# Patient Record
Sex: Female | Born: 1955 | Race: Black or African American | Hispanic: No | Marital: Married | Smoking: Former smoker
Health system: Southern US, Community
[De-identification: ages and names within clinical notes are randomized; demographics above are authoritative.]

## PROBLEM LIST (undated history)

## (undated) DIAGNOSIS — I10 Essential (primary) hypertension: Secondary | ICD-10-CM

## (undated) DIAGNOSIS — M1990 Unspecified osteoarthritis, unspecified site: Secondary | ICD-10-CM

## (undated) DIAGNOSIS — J189 Pneumonia, unspecified organism: Secondary | ICD-10-CM

## (undated) HISTORY — PX: HEMORROIDECTOMY: SUR656

## (undated) HISTORY — PX: CARPAL TUNNEL RELEASE: SHX101

## (undated) HISTORY — PX: TRIGGER FINGER RELEASE: SHX641

## (undated) HISTORY — PX: ABDOMINAL HYSTERECTOMY: SHX81

---

## 2016-02-19 ENCOUNTER — Other Ambulatory Visit (HOSPITAL_COMMUNITY): Payer: Self-pay | Admitting: Respiratory Therapy

## 2016-02-19 DIAGNOSIS — G4710 Hypersomnia, unspecified: Secondary | ICD-10-CM

## 2016-02-19 DIAGNOSIS — G4730 Sleep apnea, unspecified: Principal | ICD-10-CM

## 2016-03-15 ENCOUNTER — Ambulatory Visit: Payer: No Typology Code available for payment source | Attending: Internal Medicine | Admitting: Neurology

## 2016-03-15 DIAGNOSIS — G4710 Hypersomnia, unspecified: Secondary | ICD-10-CM

## 2016-03-15 DIAGNOSIS — F5111 Primary hypersomnia: Secondary | ICD-10-CM | POA: Diagnosis present

## 2016-03-15 DIAGNOSIS — G4730 Sleep apnea, unspecified: Secondary | ICD-10-CM | POA: Insufficient documentation

## 2016-03-24 NOTE — Procedures (Signed)
  [facility] Gerold Sar A. Gerilyn Pilgrim, MD     [email or web address]             NOCTURNAL POLYSOMNOGRAPHY   LOCATION: [facility]  Patient Name: Sandra Curry, Sandra Curry Date: 03/15/2016 Gender: Female D.O.B: [birth year] Age (years): 15 Referring Provider: Not Available Height (inches): 62 Interpreting Physician: Beryle Beams MD, ABSM Weight (lbs): 182 RPSGT: Peak, Robert BMI: 33 MRN: [id number] Neck Size: 15.00 CLINICAL INFORMATION Sleep Study Type: NPSG Indication for sleep study: N/A Epworth Sleepiness Score: 4 SLEEP STUDY TECHNIQUE As per the AASM Manual for the Scoring of Sleep and Associated Events v2.3 (April 2016) with a hypopnea requiring 4% desaturations. The channels recorded and monitored were frontal, central and occipital EEG, electrooculogram (EOG), submentalis EMG (chin), nasal and oral airflow, thoracic and abdominal wall motion, anterior tibialis EMG, snore microphone, electrocardiogram, and pulse oximetry. MEDICATIONS Patient's medications include: N/A. Medications self-administered by patient during sleep study : No sleep medicine administered. No current outpatient prescriptions on file.  SLEEP ARCHITECTURE The study was initiated at 9:25:00 PM and ended at 5:02:27 AM. Sleep onset time was 5.5 minutes and the sleep efficiency was 88.6%. The total sleep time was 405.5 minutes. Stage REM latency was 128.5 minutes. The patient spent 7.03% of the night in stage N1 sleep, 67.94% in stage N2 sleep, 7.64% in stage N3 and 17.39% in REM. Alpha intrusion was absent. Supine sleep was 3.70%. RESPIRATORY PARAMETERS The overall apnea/hypopnea index (AHI) was 2.1 per hour. There were 2 total apneas, including 0 obstructive, 2 central and 0 mixed apneas. There were 12 hypopneas and 9 RERAs. The AHI during Stage REM sleep was 2.6 per hour. AHI while supine was 4.0 per hour. The mean oxygen saturation was 94.15%. The minimum SpO2 during sleep was 89.00%. Soft snoring was  noted during this study. CARDIAC DATA The 2 lead EKG demonstrated sinus rhythm. The mean heart rate was 74.40 beats per minute. Other EKG findings include: None. LEG MOVEMENT DATA The total PLMS were 0 with a resulting PLMS index of 0.00. Associated arousal with leg movement index was 0.0.   IMPRESSIONS No significant obstructive sleep apnea occurred during this study. No significant central sleep apnea occurred during this study. The patient had minimal or no oxygen desaturation. The patient snored with Soft snoring volume. No cardiac abnormalities were noted during this study. Clinically significant periodic limb movements did not occur during sleep. No significant associated arousals.   Argie Ramming, MD Diplomate, American Board of Sleep Medicine.

## 2020-10-04 ENCOUNTER — Encounter: Payer: Self-pay | Admitting: Orthopaedic Surgery

## 2021-07-27 NOTE — Progress Notes (Signed)
Sent message, via epic in basket, requesting orders in epic from surgeon.  

## 2021-08-06 ENCOUNTER — Ambulatory Visit: Payer: Self-pay | Admitting: Physician Assistant

## 2021-08-06 DIAGNOSIS — G8929 Other chronic pain: Secondary | ICD-10-CM

## 2021-08-06 DIAGNOSIS — M1712 Unilateral primary osteoarthritis, left knee: Secondary | ICD-10-CM

## 2021-08-06 DIAGNOSIS — M25562 Pain in left knee: Secondary | ICD-10-CM

## 2021-08-06 NOTE — Patient Instructions (Addendum)
DUE TO COVID-19 ONLY ONE VISITOR IS ALLOWED TO COME WITH YOU AND STAY IN THE WAITING ROOM ONLY DURING PRE OP AND PROCEDURE.   **NO VISITORS ARE ALLOWED IN THE SHORT STAY AREA OR RECOVERY ROOM!!**  IF YOU WILL BE ADMITTED INTO THE HOSPITAL YOU ARE ALLOWED ONLY TWO SUPPORT PEOPLE DURING VISITATION HOURS ONLY (7 AM -8PM)   The support person(s) must pass our screening, gel in and out, and wear a mask at all times, including in the patient's room. Patients must also wear a mask when staff or their support person are in the room. Visitors GUEST BADGE MUST BE WORN VISIBLY  One adult visitor may remain with you overnight and MUST be in the room by 8 P.M.  No visitors under the age of 38. Any visitor under the age of 72 must be accompanied by an adult.     Your procedure is scheduled on: 08/17/21   Report to [facility] Main Entrance    Report to short stay at: 5:15 AM   Call this number if you have problems the morning of surgery [phone]   Do not eat food :After Midnight.   May have liquids until : 4:30 AM   day of surgery  CLEAR LIQUID DIET  Foods Allowed                                                                     Foods Excluded  Water, Black Coffee and tea, regular and decaf                             liquids that you cannot  Plain Jell-O in any flavor  (No red)                                           see through such as: Fruit ices (not with fruit pulp)                                     milk, soups, orange juice              Iced Popsicles (No red)                                    All solid food                                   Apple juices Sports drinks like Gatorade (No red) Lightly seasoned clear broth or consume(fat free) Sugar  Sample Menu Breakfast                                Lunch  Supper Cranberry juice                    Beef broth                            Chicken broth Jell-O                                      Grape juice                           Apple juice Coffee or tea                        Jell-O                                      Popsicle                                                Coffee or tea                        Coffee or tea     NO SOLID FOOD AFTER MIDNIGHT THE NIGHT PRIOR TO SURGERY. NOTHING BY MOUTH EXCEPT CLEAR LIQUIDS UNTIL: 4:30 AM . PLEASE FINISH ENSURE DRINK PER SURGEON ORDER  WHICH NEEDS TO BE COMPLETED AT : 4:30 AM.   Oral Hygiene is also important to reduce your risk of infection.                                    Remember - BRUSH YOUR TEETH THE MORNING OF SURGERY WITH YOUR REGULAR TOOTHPASTE   Do NOT smoke after Midnight   Take these medicines the morning of surgery with A SIP OF WATER: N/A  DO NOT TAKE ANY ORAL DIABETIC MEDICATIONS DAY OF YOUR SURGERY                              You may not have any metal on your body including hair pins, jewelry, and body piercing             Do not wear make-up, lotions, powders, perfumes/cologne, or deodorant  Do not wear nail polish including gel and S&S, artificial/acrylic nails, or any other type of covering on natural nails including finger and toenails. If you have artificial nails, gel coating, etc. that needs to be removed by a nail salon please have this removed prior to surgery or surgery may need to be canceled/ delayed if the surgeon/ anesthesia feels like they are unable to be safely monitored.   Do not shave  48 hours prior to surgery.               Do not bring valuables to the hospital. [facility] IS NOT             RESPONSIBLE   FOR VALUABLES.   Contacts, dentures or bridgework may not be worn into surgery.  Bring small overnight bag day of surgery.    Patients discharged on the day of surgery will not be allowed to drive home.   Special Instructions: Bring a copy of your healthcare power of attorney and living will documents         the day of surgery if you haven't scanned them before.               Please read over the following fact sheets you were given: IF YOU HAVE QUESTIONS ABOUT YOUR PRE-OP INSTRUCTIONS PLEASE CALL [phone]     [facility] - Preparing for Surgery Before surgery, you can play an important role.  Because skin is not sterile, your skin needs to be as free of germs as possible.  You can reduce the number of germs on your skin by washing with CHG (chlorahexidine gluconate) soap before surgery.  CHG is an antiseptic cleaner which kills germs and bonds with the skin to continue killing germs even after washing. Please DO NOT use if you have an allergy to CHG or antibacterial soaps.  If your skin becomes reddened/irritated stop using the CHG and inform your nurse when you arrive at Short Stay. Do not shave (including legs and underarms) for at least 48 hours prior to the first CHG shower.  You may shave your face/neck. Please follow these instructions carefully:  1.  Shower with CHG Soap the night before surgery and the  morning of Surgery.  2.  If you choose to wash your hair, wash your hair first as usual with your  normal  shampoo.  3.  After you shampoo, rinse your hair and body thoroughly to remove the  shampoo.                           4.  Use CHG as you would any other liquid soap.  You can apply chg directly  to the skin and wash                       Gently with a scrungie or clean washcloth.  5.  Apply the CHG Soap to your body ONLY FROM THE NECK DOWN.   Do not use on face/ open                           Wound or open sores. Avoid contact with eyes, ears mouth and genitals (private parts).                       Wash face,  Genitals (private parts) with your normal soap.             6.  Wash thoroughly, paying special attention to the area where your surgery  will be performed.  7.  Thoroughly rinse your body with warm water from the neck down.  8.  DO NOT shower/wash with your normal soap after using and rinsing off  the CHG Soap.                9.  Pat  yourself dry with a clean towel.            10.  Wear clean pajamas.            11.  Place clean sheets on your bed the night of your first shower and do not  sleep with pets. Day of Surgery :  Do not apply any lotions/deodorants the morning of surgery.  Please wear clean clothes to the hospital/surgery center.  FAILURE TO FOLLOW THESE INSTRUCTIONS MAY RESULT IN THE CANCELLATION OF YOUR SURGERY PATIENT SIGNATURE_________________________________  NURSE SIGNATURE__________________________________  ________________________________________________________________________    Rogelia Mire  An incentive spirometer is a tool that can help keep your lungs clear and active. This tool measures how well you are filling your lungs with each breath. Taking long deep breaths may help reverse or decrease the chance of developing breathing (pulmonary) problems (especially infection) following: A long period of time when you are unable to move or be active. BEFORE THE PROCEDURE  If the spirometer includes an indicator to show your best effort, your nurse or respiratory therapist will set it to a desired goal. If possible, sit up straight or lean slightly forward. Try not to slouch. Hold the incentive spirometer in an upright position. INSTRUCTIONS FOR USE  Sit on the edge of your bed if possible, or sit up as far as you can in bed or on a chair. Hold the incentive spirometer in an upright position. Breathe out normally. Place the mouthpiece in your mouth and seal your lips tightly around it. Breathe in slowly and as deeply as possible, raising the piston or the ball toward the top of the column. Hold your breath for 3-5 seconds or for as long as possible. Allow the piston or ball to fall to the bottom of the column. Remove the mouthpiece from your mouth and breathe out normally. Rest for a few seconds and repeat Steps 1 through 7 at least 10 times every 1-2 hours when you are awake. Take your time  and take a few normal breaths between deep breaths. The spirometer may include an indicator to show your best effort. Use the indicator as a goal to work toward during each repetition. After each set of 10 deep breaths, practice coughing to be sure your lungs are clear. If you have an incision (the cut made at the time of surgery), support your incision when coughing by placing a pillow or rolled up towels firmly against it. Once you are able to get out of bed, walk around indoors and cough well. You may stop using the incentive spirometer when instructed by your caregiver.  RISKS AND COMPLICATIONS Take your time so you do not get dizzy or light-headed. If you are in pain, you may need to take or ask for pain medication before doing incentive spirometry. It is harder to take a deep breath if you are having pain. AFTER USE Rest and breathe slowly and easily. It can be helpful to keep track of a log of your progress. Your caregiver can provide you with a simple table to help with this. If you are using the spirometer at home, follow these instructions: SEEK MEDICAL CARE IF:  You are having difficultly using the spirometer. You have trouble using the spirometer as often as instructed. Your pain medication is not giving enough relief while using the spirometer. You develop fever of 100.5 F (38.1 C) or higher. SEEK IMMEDIATE MEDICAL CARE IF:  You cough up bloody sputum that had not been present before. You develop fever of 102 F (38.9 C) or greater. You develop worsening pain at or near the incision site. MAKE SURE YOU:  Understand these instructions. Will watch your condition. Will get help right away if you are not doing well or get worse. Document Released: 12/23/2006 Document Revised: 11/04/2011 Document Reviewed: 02/23/2007 [employer] Patient Information  2014 [employer].   ________________________________________________________________________

## 2021-08-06 NOTE — H&P (Signed)
TOTAL KNEE ADMISSION H&P  Patient is being admitted for left total knee arthroplasty.  Subjective:  Chief Complaint:left knee pain.  HPI: Sandra Curry, 65 y.o. female, has a history of pain and functional disability in the left knee due to arthritis and has failed non-surgical conservative treatments for greater than 12 weeks to includeNSAID's and/or analgesics, corticosteriod injections, and activity modification.  Onset of symptoms was gradual, starting 4 years ago with gradually worsening course since that time. The patient noted prior procedures on the knee to include  arthroscopy and menisectomy on the left knee(s).  Patient currently rates pain in the left knee(s) at 8 out of 10 with activity. Patient has night pain, worsening of pain with activity and weight bearing, pain that interferes with activities of daily living, pain with passive range of motion, crepitus, and joint swelling.  Patient has evidence of periarticular osteophytes and joint space narrowing by imaging studies. There is no active infection.  There are no problems to display for this patient.  No past medical history on file.  PMHx: HTN, HLD, hx smoker  Current Outpatient Medications  Medication Sig Dispense Refill Last Dose   alendronate (FOSAMAX) 70 MG tablet Take 70 mg by mouth every Sunday.      benazepril (LOTENSIN) 20 MG tablet Take 20 mg by mouth daily.      hydrochlorothiazide (HYDRODIURIL) 12.5 MG tablet Take 12.5 mg by mouth every morning.      meloxicam (MOBIC) 15 MG tablet Take 15 mg by mouth daily.      rosuvastatin (CRESTOR) 10 MG tablet Take 10 mg by mouth at bedtime.      No current facility-administered medications for this visit.   No Known Allergies  Social History   Tobacco Use   Smoking status: Not on file   Smokeless tobacco: Not on file  Substance Use Topics   Alcohol use: Not on file    No family history on file.   Review of Systems  Constitutional:  Positive for activity change.   Cardiovascular:  Positive for chest pain.  Gastrointestinal:  Positive for blood in stool.  Musculoskeletal:  Positive for arthralgias.  All other systems reviewed and are negative.  Objective:  Physical Exam Constitutional:      General: She is not in acute distress.    Appearance: Normal appearance.  HENT:     Head: Normocephalic and atraumatic.  Eyes:     Extraocular Movements: Extraocular movements intact.     Pupils: Pupils are equal, round, and reactive to light.  Cardiovascular:     Rate and Rhythm: Normal rate and regular rhythm.     Pulses: Normal pulses.     Heart sounds: Normal heart sounds. No murmur heard. Pulmonary:     Effort: Pulmonary effort is normal. No respiratory distress.     Breath sounds: Normal breath sounds. No wheezing.  Abdominal:     General: Abdomen is flat. Bowel sounds are normal. There is no distension.     Palpations: Abdomen is soft.     Tenderness: There is no abdominal tenderness.  Musculoskeletal:     Cervical back: Normal range of motion and neck supple.     Comments: : Examination of the left lower extremity shows she is neurovascularly intact.  She has joint line tenderness with the knee.  Mild swelling.  No significant effusion.  No warmth or erythema.  Decent range of motion.  Lymphadenopathy:     Cervical: No cervical adenopathy.  Skin:  General: Skin is warm and dry.     Findings: No erythema or rash.  Neurological:     General: No focal deficit present.     Mental Status: She is alert and oriented to person, place, and time.  Psychiatric:        Mood and Affect: Mood normal.        Behavior: Behavior normal.    Vital signs in last 24 hours: @VSRANGES @  Labs:   Estimated body mass index is 33.29 kg/m as calculated from the following:   Height as of 03/15/16: 5\' 2"  (1.575 m).   Weight as of 03/15/16: 82.6 kg.   Imaging Review Plain radiographs demonstrate moderate degenerative joint disease of the left knee(s). The  overall alignment ismild varus. The bone quality appears to be good for age and reported activity level.      Assessment/Plan:  End stage arthritis, left knee   The patient history, physical examination, clinical judgment of the provider and imaging studies are consistent with end stage degenerative joint disease of the left knee(s) and total knee arthroplasty is deemed medically necessary. The treatment options including medical management, injection therapy arthroscopy and arthroplasty were discussed at length. The risks and benefits of total knee arthroplasty were presented and reviewed. The risks due to aseptic loosening, infection, stiffness, patella tracking problems, thromboembolic complications and other imponderables were discussed. The patient acknowledged the explanation, agreed to proceed with the plan and consent was signed. Patient is being admitted for inpatient treatment for surgery, pain control, PT, OT, prophylactic antibiotics, VTE prophylaxis, progressive ambulation and ADL's and discharge planning. The patient is planning to be discharged  home with outpt PT.    Anticipated LOS equal to or greater than 2 midnights due to - Age 50 and older with one or more of the following:  - Obesity  - Expected need for hospital services (PT, OT, Nursing) required for safe  discharge  - Anticipated need for postoperative skilled nursing care or inpatient rehab  - Active co-morbidities: None OR   - Unanticipated findings during/Post Surgery: None  - Patient is a high risk of re-admission due to: None

## 2021-08-06 NOTE — H&P (View-Only) (Signed)
TOTAL KNEE ADMISSION H&P  Patient is being admitted for left total knee arthroplasty.  Subjective:  Chief Complaint:left knee pain.  HPI: Sandra Curry, 65 y.o. female, has a history of pain and functional disability in the left knee due to arthritis and has failed non-surgical conservative treatments for greater than 12 weeks to includeNSAID's and/or analgesics, corticosteriod injections, and activity modification.  Onset of symptoms was gradual, starting 4 years ago with gradually worsening course since that time. The patient noted prior procedures on the knee to include  arthroscopy and menisectomy on the left knee(s).  Patient currently rates pain in the left knee(s) at 8 out of 10 with activity. Patient has night pain, worsening of pain with activity and weight bearing, pain that interferes with activities of daily living, pain with passive range of motion, crepitus, and joint swelling.  Patient has evidence of periarticular osteophytes and joint space narrowing by imaging studies. There is no active infection.  There are no problems to display for this patient.  No past medical history on file.  PMHx: HTN, HLD, hx smoker  Current Outpatient Medications  Medication Sig Dispense Refill Last Dose   alendronate (FOSAMAX) 70 MG tablet Take 70 mg by mouth every Sunday.      benazepril (LOTENSIN) 20 MG tablet Take 20 mg by mouth daily.      hydrochlorothiazide (HYDRODIURIL) 12.5 MG tablet Take 12.5 mg by mouth every morning.      meloxicam (MOBIC) 15 MG tablet Take 15 mg by mouth daily.      rosuvastatin (CRESTOR) 10 MG tablet Take 10 mg by mouth at bedtime.      No current facility-administered medications for this visit.   No Known Allergies  Social History   Tobacco Use   Smoking status: Not on file   Smokeless tobacco: Not on file  Substance Use Topics   Alcohol use: Not on file    No family history on file.   Review of Systems  Constitutional:  Positive for activity change.   Cardiovascular:  Positive for chest pain.  Gastrointestinal:  Positive for blood in stool.  Musculoskeletal:  Positive for arthralgias.  All other systems reviewed and are negative.  Objective:  Physical Exam Constitutional:      General: She is not in acute distress.    Appearance: Normal appearance.  HENT:     Head: Normocephalic and atraumatic.  Eyes:     Extraocular Movements: Extraocular movements intact.     Pupils: Pupils are equal, round, and reactive to light.  Cardiovascular:     Rate and Rhythm: Normal rate and regular rhythm.     Pulses: Normal pulses.     Heart sounds: Normal heart sounds. No murmur heard. Pulmonary:     Effort: Pulmonary effort is normal. No respiratory distress.     Breath sounds: Normal breath sounds. No wheezing.  Abdominal:     General: Abdomen is flat. Bowel sounds are normal. There is no distension.     Palpations: Abdomen is soft.     Tenderness: There is no abdominal tenderness.  Musculoskeletal:     Cervical back: Normal range of motion and neck supple.     Comments: : Examination of the left lower extremity shows she is neurovascularly intact.  She has joint line tenderness with the knee.  Mild swelling.  No significant effusion.  No warmth or erythema.  Decent range of motion.  Lymphadenopathy:     Cervical: No cervical adenopathy.  Skin:      General: Skin is warm and dry.     Findings: No erythema or rash.  Neurological:     General: No focal deficit present.     Mental Status: She is alert and oriented to person, place, and time.  Psychiatric:        Mood and Affect: Mood normal.        Behavior: Behavior normal.    Vital signs in last 24 hours: @VSRANGES @  Labs:   Estimated body mass index is 33.29 kg/m as calculated from the following:   Height as of 03/15/16: 5\' 2"  (1.575 m).   Weight as of 03/15/16: 82.6 kg.   Imaging Review Plain radiographs demonstrate moderate degenerative joint disease of the left knee(s). The  overall alignment ismild varus. The bone quality appears to be good for age and reported activity level.      Assessment/Plan:  End stage arthritis, left knee   The patient history, physical examination, clinical judgment of the provider and imaging studies are consistent with end stage degenerative joint disease of the left knee(s) and total knee arthroplasty is deemed medically necessary. The treatment options including medical management, injection therapy arthroscopy and arthroplasty were discussed at length. The risks and benefits of total knee arthroplasty were presented and reviewed. The risks due to aseptic loosening, infection, stiffness, patella tracking problems, thromboembolic complications and other imponderables were discussed. The patient acknowledged the explanation, agreed to proceed with the plan and consent was signed. Patient is being admitted for inpatient treatment for surgery, pain control, PT, OT, prophylactic antibiotics, VTE prophylaxis, progressive ambulation and ADL's and discharge planning. The patient is planning to be discharged  home with outpt PT.    Anticipated LOS equal to or greater than 2 midnights due to - Age 67 and older with one or more of the following:  - Obesity  - Expected need for hospital services (PT, OT, Nursing) required for safe  discharge  - Anticipated need for postoperative skilled nursing care or inpatient rehab  - Active co-morbidities: None OR   - Unanticipated findings during/Post Surgery: None  - Patient is a high risk of re-admission due to: None

## 2021-08-07 ENCOUNTER — Encounter (HOSPITAL_COMMUNITY): Payer: Self-pay

## 2021-08-07 ENCOUNTER — Other Ambulatory Visit: Payer: Self-pay

## 2021-08-07 ENCOUNTER — Encounter (HOSPITAL_COMMUNITY)
Admission: RE | Admit: 2021-08-07 | Discharge: 2021-08-07 | Disposition: A | Discharge status: Home / Self Care | Payer: 59 | Source: Ambulatory Visit | Attending: Orthopedic Surgery | Admitting: Orthopedic Surgery

## 2021-08-07 VITALS — BP 121/76 | HR 58 | Temp 98.2°F | Ht 62.0 in | Wt 166.0 lb

## 2021-08-07 DIAGNOSIS — I2510 Atherosclerotic heart disease of native coronary artery without angina pectoris: Secondary | ICD-10-CM | POA: Diagnosis not present

## 2021-08-07 DIAGNOSIS — M1712 Unilateral primary osteoarthritis, left knee: Secondary | ICD-10-CM | POA: Insufficient documentation

## 2021-08-07 DIAGNOSIS — G8929 Other chronic pain: Secondary | ICD-10-CM | POA: Diagnosis not present

## 2021-08-07 DIAGNOSIS — Z01818 Encounter for other preprocedural examination: Secondary | ICD-10-CM | POA: Diagnosis present

## 2021-08-07 DIAGNOSIS — M25562 Pain in left knee: Secondary | ICD-10-CM | POA: Diagnosis not present

## 2021-08-07 HISTORY — DX: Essential (primary) hypertension: I10

## 2021-08-07 HISTORY — DX: Pneumonia, unspecified organism: J18.9

## 2021-08-07 HISTORY — DX: Unspecified osteoarthritis, unspecified site: M19.90

## 2021-08-07 LAB — CBC WITH DIFFERENTIAL/PLATELET
Abs Immature Granulocytes: 0.03 10*3/uL (ref 0.00–0.07)
Basophils Absolute: 0.1 10*3/uL (ref 0.0–0.1)
Basophils Relative: 1 %
Eosinophils Absolute: 0.1 10*3/uL (ref 0.0–0.5)
Eosinophils Relative: 1 %
HCT: 34.1 % — ABNORMAL LOW (ref 36.0–46.0)
Hemoglobin: 10.6 g/dL — ABNORMAL LOW (ref 12.0–15.0)
Immature Granulocytes: 0 %
Lymphocytes Relative: 40 %
Lymphs Abs: 2.8 10*3/uL (ref 0.7–4.0)
MCH: 24.9 pg — ABNORMAL LOW (ref 26.0–34.0)
MCHC: 31.1 g/dL (ref 30.0–36.0)
MCV: 80 fL (ref 80.0–100.0)
Monocytes Absolute: 0.8 10*3/uL (ref 0.1–1.0)
Monocytes Relative: 11 %
Neutro Abs: 3.2 10*3/uL (ref 1.7–7.7)
Neutrophils Relative %: 47 %
Platelets: 339 10*3/uL (ref 150–400)
RBC: 4.26 MIL/uL (ref 3.87–5.11)
RDW: 14.7 % (ref 11.5–15.5)
WBC: 6.9 10*3/uL (ref 4.0–10.5)
nRBC: 0 % (ref 0.0–0.2)

## 2021-08-07 LAB — COMPREHENSIVE METABOLIC PANEL
ALT: 18 U/L (ref 0–44)
AST: 19 U/L (ref 15–41)
Albumin: 4.1 g/dL (ref 3.5–5.0)
Alkaline Phosphatase: 51 U/L (ref 38–126)
Anion gap: 5 (ref 5–15)
BUN: 16 mg/dL (ref 8–23)
CO2: 27 mmol/L (ref 22–32)
Calcium: 9.1 mg/dL (ref 8.9–10.3)
Chloride: 107 mmol/L (ref 98–111)
Creatinine, Ser: 0.59 mg/dL (ref 0.44–1.00)
GFR, Estimated: >60 mL/min (ref >60)
Glucose, Bld: 98 mg/dL (ref 70–99)
Potassium: 4.2 mmol/L (ref 3.5–5.1)
Sodium: 139 mmol/L (ref 135–145)
Total Bilirubin: 0.5 mg/dL (ref 0.3–1.2)
Total Protein: 7.2 g/dL (ref 6.5–8.1)

## 2021-08-07 LAB — URINALYSIS, ROUTINE W REFLEX MICROSCOPIC
Bilirubin Urine: NEGATIVE
Glucose, UA: NEGATIVE mg/dL
Hgb urine dipstick: NEGATIVE
Ketones, ur: NEGATIVE mg/dL
Leukocytes,Ua: NEGATIVE
Nitrite: NEGATIVE
Protein, ur: NEGATIVE mg/dL
Specific Gravity, Urine: 1.005 (ref 1.005–1.030)
pH: 7 (ref 5.0–8.0)

## 2021-08-07 LAB — SURGICAL PCR SCREEN
MRSA, PCR: NEGATIVE
Staphylococcus aureus: NEGATIVE

## 2021-08-07 NOTE — Progress Notes (Signed)
COVID Vaccine Completed: Yes Date COVID Vaccine completed: 2021 x 1 COVID vaccine manufacturer: [employer]  COVID Test: N/A PCP - Dr. Kirstie Peri. : Clearance: 07/05/21: Chart Cardiologist -   Chest x-ray -  EKG -  Stress Test -  ECHO -  Cardiac Cath -  Pacemaker/ICD device last checked:  Sleep Study -  CPAP -   Fasting Blood Sugar -  Checks Blood Sugar _____ times a day  Blood Thinner Instructions: Aspirin Instructions: Last Dose:  Anesthesia review:   Patient denies shortness of breath, fever, cough and chest pain at PAT appointment   Patient verbalized understanding of instructions that were given to them at the PAT appointment. Patient was also instructed that they will need to review over the PAT instructions again at home before surgery.

## 2021-08-16 ENCOUNTER — Encounter (HOSPITAL_COMMUNITY): Payer: Self-pay | Admitting: Orthopedic Surgery

## 2021-08-16 NOTE — Anesthesia Preprocedure Evaluation (Addendum)
Anesthesia Evaluation  Patient identified by MRN, date of birth, ID band Patient awake    Reviewed: Allergy & Precautions, NPO status , Patient's Chart, lab work & pertinent test results, reviewed documented beta blocker date and time   Airway Mallampati: II  TM Distance: >3 FB Neck ROM: Full    Dental no notable dental hx. (+) Teeth Intact, Dental Advisory Given   Pulmonary Patient abstained from smoking., former smoker,    Pulmonary exam normal breath sounds clear to auscultation       Cardiovascular hypertension, Pt. on medications Normal cardiovascular exam Rhythm:Regular Rate:Normal  EKG 08/07/21 NSR, Normal EKG   Neuro/Psych negative neurological ROS  negative psych ROS   GI/Hepatic negative GI ROS, Neg liver ROS,   Endo/Other  negative endocrine ROSHyperlipidemia Osteoporosis  Renal/GU negative Renal ROS  negative genitourinary   Musculoskeletal  (+) Arthritis , Osteoarthritis,  OA Left knee   Abdominal (+) + obese,   Peds  Hematology negative hematology ROS (+)   Anesthesia Other Findings   Reproductive/Obstetrics                            Anesthesia Physical Anesthesia Plan  ASA: 2  Anesthesia Plan: Spinal   Post-op Pain Management: Regional block   Induction:   PONV Risk Score and Plan: 3 and Treatment may vary due to age or medical condition, Propofol infusion, Ondansetron and Midazolam  Airway Management Planned: Natural Airway  Additional Equipment:   Intra-op Plan:   Post-operative Plan:   Informed Consent: I have reviewed the patients History and Physical, chart, labs and discussed the procedure including the risks, benefits and alternatives for the proposed anesthesia with the patient or authorized representative who has indicated his/her understanding and acceptance.     Dental advisory given  Plan Discussed with: CRNA and Anesthesiologist  Anesthesia  Plan Comments:        Anesthesia Quick Evaluation

## 2021-08-17 ENCOUNTER — Ambulatory Visit (HOSPITAL_COMMUNITY): Payer: 59 | Admitting: Anesthesiology

## 2021-08-17 ENCOUNTER — Encounter (HOSPITAL_COMMUNITY): Payer: Self-pay | Admitting: Orthopedic Surgery

## 2021-08-17 ENCOUNTER — Ambulatory Visit (HOSPITAL_COMMUNITY)
Admission: RE | Admit: 2021-08-17 | Discharge: 2021-08-17 | Disposition: A | Discharge status: Home / Self Care | Payer: 59 | Source: Ambulatory Visit | Attending: Orthopedic Surgery | Admitting: Orthopedic Surgery

## 2021-08-17 ENCOUNTER — Encounter (HOSPITAL_COMMUNITY)
Admission: RE | Disposition: A | Discharge status: Home / Self Care | Payer: Self-pay | Source: Ambulatory Visit | Attending: Orthopedic Surgery

## 2021-08-17 DIAGNOSIS — E669 Obesity, unspecified: Secondary | ICD-10-CM | POA: Insufficient documentation

## 2021-08-17 DIAGNOSIS — Z87891 Personal history of nicotine dependence: Secondary | ICD-10-CM | POA: Diagnosis not present

## 2021-08-17 DIAGNOSIS — Z6830 Body mass index (BMI) 30.0-30.9, adult: Secondary | ICD-10-CM | POA: Diagnosis not present

## 2021-08-17 DIAGNOSIS — M1712 Unilateral primary osteoarthritis, left knee: Secondary | ICD-10-CM | POA: Insufficient documentation

## 2021-08-17 DIAGNOSIS — M238X2 Other internal derangements of left knee: Secondary | ICD-10-CM | POA: Insufficient documentation

## 2021-08-17 DIAGNOSIS — M25762 Osteophyte, left knee: Secondary | ICD-10-CM | POA: Diagnosis not present

## 2021-08-17 DIAGNOSIS — M25462 Effusion, left knee: Secondary | ICD-10-CM | POA: Diagnosis not present

## 2021-08-17 DIAGNOSIS — I10 Essential (primary) hypertension: Secondary | ICD-10-CM | POA: Diagnosis not present

## 2021-08-17 DIAGNOSIS — I2510 Atherosclerotic heart disease of native coronary artery without angina pectoris: Secondary | ICD-10-CM

## 2021-08-17 HISTORY — PX: TOTAL KNEE ARTHROPLASTY: SHX125

## 2021-08-17 LAB — TYPE AND SCREEN
ABO/RH(D): O POS
Antibody Screen: NEGATIVE

## 2021-08-17 LAB — ABO/RH: ABO/RH(D): O POS

## 2021-08-17 SURGERY — ARTHROPLASTY, KNEE, TOTAL
Anesthesia: Spinal | Site: Knee | Laterality: Left

## 2021-08-17 MED ORDER — POTASSIUM CHLORIDE IN NACL 20-0.9 MEQ/L-% IV SOLN
INTRAVENOUS | Status: DC
  Filled 2021-08-17: qty 1000

## 2021-08-17 MED ORDER — CEFAZOLIN SODIUM-DEXTROSE 2-4 GM/100ML-% IV SOLN
INTRAVENOUS | Status: AC
  Filled 2021-08-17: qty 100

## 2021-08-17 MED ORDER — METHOCARBAMOL 500 MG PO TABS: 500.0000 mg | ORAL_TABLET | Freq: Four times a day (QID) | ORAL | 0 refills | Status: AC | PRN

## 2021-08-17 MED ORDER — ACETAMINOPHEN 325 MG PO TABS: 325.0000 mg | ORAL_TABLET | Freq: Four times a day (QID) | ORAL | Status: DC | PRN

## 2021-08-17 MED ORDER — MIDAZOLAM HCL 2 MG/2ML IJ SOLN
INTRAMUSCULAR | Status: DC | PRN
  Administered 2021-08-17 (×2): 1 mg via INTRAVENOUS

## 2021-08-17 MED ORDER — FENTANYL CITRATE PF 50 MCG/ML IJ SOSY
25.0000 ug | PREFILLED_SYRINGE | INTRAMUSCULAR | Status: DC | PRN
  Administered 2021-08-17: 10:00:00 50 ug via INTRAVENOUS

## 2021-08-17 MED ORDER — BUPIVACAINE-EPINEPHRINE (PF) 0.25% -1:200000 IJ SOLN
INTRAMUSCULAR | Status: DC | PRN
  Administered 2021-08-17: 30 mL via PERINEURAL

## 2021-08-17 MED ORDER — METOCLOPRAMIDE HCL 5 MG PO TABS
5.0000 mg | ORAL_TABLET | Freq: Three times a day (TID) | ORAL | Status: DC | PRN
  Filled 2021-08-17: qty 2

## 2021-08-17 MED ORDER — ACETAMINOPHEN 325 MG PO TABS: 650.0000 mg | ORAL_TABLET | ORAL | 2 refills | Status: AC | PRN

## 2021-08-17 MED ORDER — TRANEXAMIC ACID 1000 MG/10ML IV SOLN
2000.0000 mg | INTRAVENOUS | Status: DC
  Filled 2021-08-17: qty 20

## 2021-08-17 MED ORDER — SODIUM CHLORIDE 0.9 % IR SOLN
Status: DC | PRN
  Administered 2021-08-17: 1000 mL

## 2021-08-17 MED ORDER — CEFAZOLIN SODIUM-DEXTROSE 2-4 GM/100ML-% IV SOLN: 2.0000 g | Freq: Four times a day (QID) | INTRAVENOUS | Status: DC

## 2021-08-17 MED ORDER — OXYCODONE HCL 5 MG/5ML PO SOLN: 5.0000 mg | Freq: Once | ORAL | Status: AC | PRN

## 2021-08-17 MED ORDER — FENTANYL CITRATE (PF) 100 MCG/2ML IJ SOLN
INTRAMUSCULAR | Status: DC | PRN
  Administered 2021-08-17 (×4): 50 ug via INTRAVENOUS

## 2021-08-17 MED ORDER — BUPIVACAINE LIPOSOME 1.3 % IJ SUSP: 20.0000 mL | Freq: Once | INTRAMUSCULAR | Status: DC

## 2021-08-17 MED ORDER — METHOCARBAMOL 500 MG IVPB - SIMPLE MED
INTRAVENOUS | Status: AC
  Filled 2021-08-17: qty 50

## 2021-08-17 MED ORDER — FENTANYL CITRATE PF 50 MCG/ML IJ SOSY
PREFILLED_SYRINGE | INTRAMUSCULAR | Status: AC
  Administered 2021-08-17: 11:00:00 50 ug via INTRAVENOUS
  Filled 2021-08-17: qty 3

## 2021-08-17 MED ORDER — ACETAMINOPHEN 500 MG PO TABS
1000.0000 mg | ORAL_TABLET | Freq: Once | ORAL | Status: AC
  Administered 2021-08-17: 06:00:00 1000 mg via ORAL
  Filled 2021-08-17: qty 2

## 2021-08-17 MED ORDER — CELECOXIB 200 MG PO CAPS: 200.0000 mg | ORAL_CAPSULE | Freq: Two times a day (BID) | ORAL | 0 refills | Status: AC

## 2021-08-17 MED ORDER — ASPIRIN EC 81 MG PO TBEC: 81.0000 mg | DELAYED_RELEASE_TABLET | Freq: Two times a day (BID) | ORAL | 0 refills | Status: AC

## 2021-08-17 MED ORDER — OXYCODONE HCL 5 MG PO TABS
ORAL_TABLET | ORAL | Status: AC
  Administered 2021-08-17: 12:00:00 10 mg via ORAL
  Filled 2021-08-17: qty 1

## 2021-08-17 MED ORDER — BUPIVACAINE IN DEXTROSE 0.75-8.25 % IT SOLN
INTRATHECAL | Status: DC | PRN
  Administered 2021-08-17: 1.6 mL via INTRATHECAL

## 2021-08-17 MED ORDER — ONDANSETRON HCL 4 MG/2ML IJ SOLN: 4.0000 mg | Freq: Once | INTRAMUSCULAR | Status: DC | PRN

## 2021-08-17 MED ORDER — LACTATED RINGERS IV BOLUS
500.0000 mL | Freq: Once | INTRAVENOUS | Status: AC
  Administered 2021-08-17: 10:00:00 500 mL via INTRAVENOUS

## 2021-08-17 MED ORDER — PHENYLEPHRINE HCL-NACL 20-0.9 MG/250ML-% IV SOLN
INTRAVENOUS | Status: DC | PRN
  Administered 2021-08-17: 25 ug/min via INTRAVENOUS

## 2021-08-17 MED ORDER — PROPOFOL 10 MG/ML IV BOLUS
INTRAVENOUS | Status: AC
  Filled 2021-08-17: qty 20

## 2021-08-17 MED ORDER — PROPOFOL 10 MG/ML IV BOLUS
INTRAVENOUS | Status: DC | PRN
  Administered 2021-08-17 (×3): 20 mg via INTRAVENOUS

## 2021-08-17 MED ORDER — BUPIVACAINE LIPOSOME 1.3 % IJ SUSP
INTRAMUSCULAR | Status: AC
  Filled 2021-08-17: qty 20

## 2021-08-17 MED ORDER — ORAL CARE MOUTH RINSE: 15.0000 mL | Freq: Once | OROMUCOSAL | Status: AC

## 2021-08-17 MED ORDER — FENTANYL CITRATE (PF) 100 MCG/2ML IJ SOLN
INTRAMUSCULAR | Status: AC
  Filled 2021-08-17: qty 2

## 2021-08-17 MED ORDER — FENTANYL CITRATE (PF) 250 MCG/5ML IJ SOLN: INTRAMUSCULAR | Status: DC | PRN

## 2021-08-17 MED ORDER — TRANEXAMIC ACID-NACL 1000-0.7 MG/100ML-% IV SOLN
1000.0000 mg | INTRAVENOUS | Status: AC
  Administered 2021-08-17: 08:00:00 1000 mg via INTRAVENOUS
  Filled 2021-08-17: qty 100

## 2021-08-17 MED ORDER — CEFAZOLIN SODIUM-DEXTROSE 2-4 GM/100ML-% IV SOLN
2.0000 g | INTRAVENOUS | Status: AC
  Administered 2021-08-17: 08:00:00 2 g via INTRAVENOUS
  Filled 2021-08-17: qty 100

## 2021-08-17 MED ORDER — LACTATED RINGERS IV BOLUS
250.0000 mL | Freq: Once | INTRAVENOUS | Status: AC
  Administered 2021-08-17: 10:00:00 250 mL via INTRAVENOUS

## 2021-08-17 MED ORDER — PHENYLEPHRINE 40 MCG/ML (10ML) SYRINGE FOR IV PUSH (FOR BLOOD PRESSURE SUPPORT)
PREFILLED_SYRINGE | INTRAVENOUS | Status: DC | PRN
  Administered 2021-08-17: 200 ug via INTRAVENOUS

## 2021-08-17 MED ORDER — TRANEXAMIC ACID 1000 MG/10ML IV SOLN
INTRAVENOUS | Status: DC | PRN
  Administered 2021-08-17: 08:00:00 2000 mg via TOPICAL

## 2021-08-17 MED ORDER — METHOCARBAMOL 500 MG PO TABS: 500.0000 mg | ORAL_TABLET | Freq: Four times a day (QID) | ORAL | Status: DC | PRN

## 2021-08-17 MED ORDER — ONDANSETRON HCL 4 MG PO TABS
4.0000 mg | ORAL_TABLET | Freq: Four times a day (QID) | ORAL | Status: DC | PRN
  Filled 2021-08-17: qty 1

## 2021-08-17 MED ORDER — OXYCODONE HCL 5 MG PO TABS: ORAL_TABLET | ORAL | 0 refills | Status: AC

## 2021-08-17 MED ORDER — ONDANSETRON HCL 4 MG/2ML IJ SOLN: 4.0000 mg | Freq: Four times a day (QID) | INTRAMUSCULAR | Status: DC | PRN

## 2021-08-17 MED ORDER — OXYCODONE HCL 5 MG PO TABS
5.0000 mg | ORAL_TABLET | Freq: Once | ORAL | Status: AC | PRN
  Administered 2021-08-17: 10:00:00 5 mg via ORAL

## 2021-08-17 MED ORDER — TRANEXAMIC ACID-NACL 1000-0.7 MG/100ML-% IV SOLN: 1000.0000 mg | Freq: Once | INTRAVENOUS | Status: DC

## 2021-08-17 MED ORDER — HYDROMORPHONE HCL 1 MG/ML IJ SOLN
0.5000 mg | INTRAMUSCULAR | Status: DC | PRN
  Administered 2021-08-17: 12:00:00 0.5 mg via INTRAVENOUS

## 2021-08-17 MED ORDER — ACETAMINOPHEN 500 MG PO TABS: 1000.0000 mg | ORAL_TABLET | Freq: Four times a day (QID) | ORAL | Status: DC

## 2021-08-17 MED ORDER — PROPOFOL 500 MG/50ML IV EMUL
INTRAVENOUS | Status: DC | PRN
Start: 2021-08-17 — End: 2021-08-17
  Administered 2021-08-17: 100 ug/kg/min via INTRAVENOUS

## 2021-08-17 MED ORDER — CHLORHEXIDINE GLUCONATE 0.12 % MT SOLN
15.0000 mL | Freq: Once | OROMUCOSAL | Status: AC
  Administered 2021-08-17: 06:00:00 15 mL via OROMUCOSAL

## 2021-08-17 MED ORDER — PHENYLEPHRINE HCL (PRESSORS) 10 MG/ML IV SOLN
INTRAVENOUS | Status: AC
  Filled 2021-08-17: qty 1

## 2021-08-17 MED ORDER — POVIDONE-IODINE 10 % EX SWAB
2.0000 "application " | Freq: Once | CUTANEOUS | Status: AC
  Administered 2021-08-17: 2 via TOPICAL

## 2021-08-17 MED ORDER — DOCUSATE SODIUM 100 MG PO CAPS: 100.0000 mg | ORAL_CAPSULE | Freq: Every day | ORAL | 2 refills | Status: AC | PRN

## 2021-08-17 MED ORDER — OXYCODONE HCL 5 MG PO TABS
ORAL_TABLET | ORAL | Status: AC
  Filled 2021-08-17: qty 2

## 2021-08-17 MED ORDER — SODIUM CHLORIDE 0.9% FLUSH
INTRAVENOUS | Status: DC | PRN
  Administered 2021-08-17: 50 mL via INTRAVENOUS

## 2021-08-17 MED ORDER — WATER FOR IRRIGATION, STERILE IR SOLN
Status: DC | PRN
  Administered 2021-08-17: 2000 mL

## 2021-08-17 MED ORDER — TRANEXAMIC ACID-NACL 1000-0.7 MG/100ML-% IV SOLN
INTRAVENOUS | Status: AC
  Filled 2021-08-17: qty 100

## 2021-08-17 MED ORDER — ONDANSETRON HCL 4 MG/2ML IJ SOLN
INTRAMUSCULAR | Status: DC | PRN
  Administered 2021-08-17: 4 mg via INTRAVENOUS

## 2021-08-17 MED ORDER — OXYCODONE HCL 5 MG PO TABS: 5.0000 mg | ORAL_TABLET | ORAL | Status: DC | PRN

## 2021-08-17 MED ORDER — METHOCARBAMOL 500 MG IVPB - SIMPLE MED
500.0000 mg | Freq: Four times a day (QID) | INTRAVENOUS | Status: DC | PRN
  Administered 2021-08-17: 10:00:00 500 mg via INTRAVENOUS

## 2021-08-17 MED ORDER — BUPIVACAINE LIPOSOME 1.3 % IJ SUSP
INTRAMUSCULAR | Status: DC | PRN
  Administered 2021-08-17: 20 mL

## 2021-08-17 MED ORDER — METOCLOPRAMIDE HCL 5 MG/ML IJ SOLN: 5.0000 mg | Freq: Three times a day (TID) | INTRAMUSCULAR | Status: DC | PRN

## 2021-08-17 MED ORDER — MIDAZOLAM HCL 2 MG/2ML IJ SOLN
INTRAMUSCULAR | Status: AC
  Filled 2021-08-17: qty 2

## 2021-08-17 MED ORDER — ROPIVACAINE HCL 7.5 MG/ML IJ SOLN
INTRAMUSCULAR | Status: DC | PRN
  Administered 2021-08-17: 20 mL via PERINEURAL

## 2021-08-17 MED ORDER — DEXAMETHASONE SODIUM PHOSPHATE 10 MG/ML IJ SOLN: INTRAMUSCULAR | Status: DC | PRN

## 2021-08-17 MED ORDER — BUPIVACAINE-EPINEPHRINE (PF) 0.25% -1:200000 IJ SOLN
INTRAMUSCULAR | Status: AC
  Filled 2021-08-17: qty 30

## 2021-08-17 MED ORDER — HYDROMORPHONE HCL 1 MG/ML IJ SOLN
INTRAMUSCULAR | Status: AC
  Filled 2021-08-17: qty 1

## 2021-08-17 MED ORDER — LIDOCAINE 2% (20 MG/ML) 5 ML SYRINGE
INTRAMUSCULAR | Status: DC | PRN
  Administered 2021-08-17: 20 mg via INTRAVENOUS
  Administered 2021-08-17: 80 mg via INTRAVENOUS

## 2021-08-17 MED ORDER — LACTATED RINGERS IV SOLN: INTRAVENOUS | Status: DC

## 2021-08-17 SURGICAL SUPPLY — 55 items
ATTUNE MED DOME PAT 32 KNEE (Knees) ×1 IMPLANT
ATTUNE PS FEM LT SZ 4 CEM KNEE (Femur) ×1 IMPLANT
ATTUNE PSRP INSR SZ4 5 KNEE (Insert) ×1 IMPLANT
BAG COUNTER SPONGE SURGICOUNT (BAG) ×2 IMPLANT
BAG DECANTER FOR FLEXI CONT (MISCELLANEOUS) ×2 IMPLANT
BAG ZIPLOCK 12X15 (MISCELLANEOUS) ×2 IMPLANT
BASE TIBIAL ROT PLAT SZ 3 KNEE (Knees) IMPLANT
BLADE SAGITTAL 25.0X1.19X90 (BLADE) ×2 IMPLANT
BLADE SAW SGTL 13X75X1.27 (BLADE) ×2 IMPLANT
BLADE SURG 15 STRL LF DISP TIS (BLADE) ×1 IMPLANT
BLADE SURG 15 STRL SS (BLADE) ×1
BLADE SURG SZ10 CARB STEEL (BLADE) ×4 IMPLANT
BNDG ELASTIC 6X15 VLCR STRL LF (GAUZE/BANDAGES/DRESSINGS) ×2 IMPLANT
BOWL SMART MIX CTS (DISPOSABLE) ×2 IMPLANT
CEMENT HV SMART SET (Cement) ×4 IMPLANT
CLSR STERI-STRIP ANTIMIC 1/2X4 (GAUZE/BANDAGES/DRESSINGS) ×4 IMPLANT
COVER SURGICAL LIGHT HANDLE (MISCELLANEOUS) ×2 IMPLANT
CUFF TOURN SGL QUICK 34 (TOURNIQUET CUFF) ×1
CUFF TRNQT CYL 34X4.125X (TOURNIQUET CUFF) ×1 IMPLANT
DECANTER SPIKE VIAL GLASS SM (MISCELLANEOUS) ×4 IMPLANT
DRAPE INCISE IOBAN 66X45 STRL (DRAPES) ×2 IMPLANT
DRAPE U-SHAPE 47X51 STRL (DRAPES) ×2 IMPLANT
DRESSING AQUACEL AG SP 3.5X10 (GAUZE/BANDAGES/DRESSINGS) ×1 IMPLANT
DRSG AQUACEL AG SP 3.5X10 (GAUZE/BANDAGES/DRESSINGS) ×2
DURAPREP 26ML APPLICATOR (WOUND CARE) ×4 IMPLANT
ELECT REM PT RETURN 15FT ADLT (MISCELLANEOUS) ×2 IMPLANT
GLOVE SRG 8 PF TXTR STRL LF DI (GLOVE) ×2 IMPLANT
GLOVE SURG ORTHO LTX SZ8 (GLOVE) ×2 IMPLANT
GLOVE SURG POLYISO LF SZ7.5 (GLOVE) ×2 IMPLANT
GLOVE SURG UNDER POLY LF SZ8 (GLOVE) ×2
GOWN STRL REUS W/TWL XL LVL3 (GOWN DISPOSABLE) ×8 IMPLANT
HANDPIECE INTERPULSE COAX TIP (DISPOSABLE) ×1
HOLDER FOLEY CATH W/STRAP (MISCELLANEOUS) IMPLANT
HOOD PEEL AWAY FLYTE STAYCOOL (MISCELLANEOUS) ×2 IMPLANT
IMMOBILIZER KNEE 20 (SOFTGOODS) ×2 IMPLANT
IMMOBILIZER KNEE 20 THIGH 36 (SOFTGOODS) ×1 IMPLANT
KIT TURNOVER KIT A (KITS) IMPLANT
MANIFOLD NEPTUNE II (INSTRUMENTS) ×2 IMPLANT
NEEDLE HYPO 22GX1.5 SAFETY (NEEDLE) ×4 IMPLANT
NS IRRIG 1000ML POUR BTL (IV SOLUTION) ×2 IMPLANT
PACK TOTAL KNEE CUSTOM (KITS) ×2 IMPLANT
PROTECTOR NERVE ULNAR (MISCELLANEOUS) ×2 IMPLANT
SET HNDPC FAN SPRY TIP SCT (DISPOSABLE) ×1 IMPLANT
STRIP CLOSURE SKIN 1/2X4 (GAUZE/BANDAGES/DRESSINGS) ×2 IMPLANT
SUT ETHIBOND NAB CT1 #1 30IN (SUTURE) ×4 IMPLANT
SUT MNCRL AB 3-0 PS2 18 (SUTURE) ×2 IMPLANT
SUT VIC AB 0 CT1 36 (SUTURE) ×2 IMPLANT
SUT VIC AB 2-0 CT1 27 (SUTURE) ×2
SUT VIC AB 2-0 CT1 TAPERPNT 27 (SUTURE) ×2 IMPLANT
SYR CONTROL 10ML LL (SYRINGE) ×6 IMPLANT
TIBIAL BASE ROT PLAT SZ 3 KNEE (Knees) ×2 IMPLANT
TOWEL OR 17X26 10 PK STRL BLUE (TOWEL DISPOSABLE) ×2 IMPLANT
TRAY FOLEY MTR SLVR 16FR STAT (SET/KITS/TRAYS/PACK) ×2 IMPLANT
WATER STERILE IRR 1000ML POUR (IV SOLUTION) ×4 IMPLANT
WRAP KNEE MAXI GEL POST OP (GAUZE/BANDAGES/DRESSINGS) ×2 IMPLANT

## 2021-08-17 NOTE — Anesthesia Procedure Notes (Signed)
Date/Time: 08/17/2021 7:31 AM Performed by: Minerva Ends, CRNA Pre-anesthesia Checklist: Patient identified, Emergency Drugs available, Suction available, Patient being monitored and Timeout performed Patient Re-evaluated:Patient Re-evaluated prior to induction Oxygen Delivery Method: Simple face mask Placement Confirmation: positive ETCO2 and breath sounds checked- equal and bilateral Dental Injury: Teeth and Oropharynx as per pre-operative assessment

## 2021-08-17 NOTE — Progress Notes (Signed)
Orthopedic Tech Progress Note Patient Details:  Sandra Curry 07-29-1956 [id number]  Ortho Devices Type of Ortho Device: Bone foam zero knee Ortho Device/Splint Interventions: Ordered      Axavier Pressley E Catherene Kaleta 08/17/2021, 11:13 AM

## 2021-08-17 NOTE — Discharge Instructions (Signed)

## 2021-08-17 NOTE — Transfer of Care (Signed)
Immediate Anesthesia Transfer of Care Note  Patient: Sandra Curry  Procedure(s) Performed: TOTAL KNEE ARTHROPLASTY (Left: Knee)  Patient Location: PACU  Anesthesia Type:Spinal  Level of Consciousness: awake and alert   Airway & Oxygen Therapy: Patient Spontanous Breathing and Patient connected to face mask oxygen  Post-op Assessment: Report given to RN and Post -op Vital signs reviewed and stable  Post vital signs: Reviewed and stable  Last Vitals:  Vitals Value Taken Time  BP    Temp    Pulse 72 08/17/21 0948  Resp 11 08/17/21 0948  SpO2 95 % 08/17/21 0948  Vitals shown include unvalidated device data.  Last Pain:  Vitals:   08/17/21 0557  TempSrc: Oral  PainSc:          Complications: No notable events documented.

## 2021-08-17 NOTE — Evaluation (Signed)
Physical Therapy Evaluation Patient Details Name: Sandra Curry MRN: [id number] DOB: 02-14-1956 Today's Date: 08/17/2021  History of Present Illness  Patient is 65 y.o. female s/p Lt TKA on 08/17/21 with PMH significant for OA, HTN.  Clinical Impression  Sandra Curry is a 65 y.o. female POD 0 s/p Lt TKA. Patient reports independence with mobility at baseline. Patient is now limited by functional impairments (see PT problem list below) and requires min assist  for transfers and gait with RW. Patient was able to ambulate ~20 feet with RW and min assist. Patient lethargic throughout and eyes closing at time during mobility. Further gait training deferred for pt to ne more alert. Patient will benefit from continued skilled PT interventions to address impairments and progress towards PLOF. Acute PT will follow to progress mobility and stair training in preparation for safe discharge home.        Recommendations for follow up therapy are one component of a multi-disciplinary discharge planning process, led by the attending physician.  Recommendations may be updated based on patient status, additional functional criteria and insurance authorization.  Follow Up Recommendations Follow physician's recommendations for discharge plan and follow up therapies    Assistance Recommended at Discharge Frequent or constant Supervision/Assistance  Functional Status Assessment Patient has had a recent decline in their functional status and demonstrates the ability to make significant improvements in function in a reasonable and predictable amount of time.  Equipment Recommendations  None recommended by PT    Recommendations for Other Services       Precautions / Restrictions Precautions Precautions: Fall Restrictions Weight Bearing Restrictions: No      Mobility  Bed Mobility Overal bed mobility: Needs Assistance Bed Mobility: Supine to Sit     Supine to sit: Min guard;HOB elevated     General  bed mobility comments: pt taking extra time and cues needed to sequence, guarding for safety due to lethargy    Transfers Overall transfer level: Needs assistance Equipment used: Rolling walker (2 wheels) Transfers: Sit to/from Stand Sit to Stand: Min assist           General transfer comment: cues for hand placement on RW and technique for power up. assist needed to steady with rise. cues for safe reach back and to extend Lt knee prior to sit.    Ambulation/Gait Ambulation/Gait assistance: Min assist Gait Distance (Feet): 15 Feet Assistive device: Rolling walker (2 wheels) Gait Pattern/deviations: Step-to pattern;Decreased stride length;Decreased weight shift to left Gait velocity: decr     General Gait Details: cues for step to pattern and proximity to RW, assist to steady due to lethargy (pt closing eyes and groggy while up) seated rest provided for safety.  Stairs            Wheelchair Mobility    Modified Rankin (Stroke Patients Only)       Balance Overall balance assessment: Needs assistance Sitting-balance support: Feet supported;Bilateral upper extremity supported Sitting balance-Leahy Scale: Fair     Standing balance support: Reliant on assistive device for balance;During functional activity;Bilateral upper extremity supported Standing balance-Leahy Scale: Poor                               Pertinent Vitals/Pain Pain Assessment: 0-10 Pain Location: Lt knee Pain Descriptors / Indicators: Aching;Discomfort Pain Intervention(s): Limited activity within patient's tolerance;Monitored during session;Premedicated before session;Repositioned;Ice applied    Home Living Family/patient expects to be discharged to:: Private residence Living  Arrangements: Spouse/significant other;Children Available Help at Discharge: Family Type of Home: House Home Access: Level entry (small threshold at door)       Home Layout: One level;Laundry or work area  in [employer] Equipment: Agricultural consultant (2 wheels);Cane - single point;BSC/3in1      Prior Function Prior Level of Function : Independent/Modified Independent               ADLs Comments: husband helped with socks and shoes on Lt foot     Hand Dominance   Dominant Hand: Right    Extremity/Trunk Assessment   Upper Extremity Assessment Upper Extremity Assessment: Overall WFL for tasks assessed    Lower Extremity Assessment Lower Extremity Assessment: Generalized weakness    Cervical / Trunk Assessment Cervical / Trunk Assessment: Normal  Communication   Communication: No difficulties  Cognition Arousal/Alertness: Lethargic;Suspect due to medications Behavior During Therapy: [facility] for tasks assessed/performed Overall Cognitive Status: Within Functional Limits for tasks assessed                                          General Comments      Exercises     Assessment/Plan    PT Assessment Patient needs continued PT services  PT Problem List Decreased strength;Decreased range of motion;Decreased activity tolerance;Decreased balance;Decreased mobility;Decreased knowledge of use of DME;Decreased knowledge of precautions;Pain;Obesity       PT Treatment Interventions DME instruction;Gait training;Stair training;Functional mobility training;Therapeutic activities;Therapeutic exercise;Balance training;Patient/family education    PT Goals (Current goals can be found in the Care Plan section)  Acute Rehab PT Goals Patient Stated Goal: get home today PT Goal Formulation: With patient Time For Goal Achievement: 08/24/21 Potential to Achieve Goals: Good    Frequency 7X/week   Barriers to discharge        Co-evaluation               AM-PAC PT "6 Clicks" Mobility  Outcome Measure Help needed turning from your back to your side while in a flat bed without using bedrails?: A Little Help needed moving from lying on your back to sitting on the  side of a flat bed without using bedrails?: A Little Help needed moving to and from a bed to a chair (including a wheelchair)?: A Little Help needed standing up from a chair using your arms (e.g., wheelchair or bedside chair)?: A Little Help needed to walk in hospital room?: A Little Help needed climbing 3-5 steps with a railing? : A Lot 6 Click Score: 17    End of Session Equipment Utilized During Treatment: Gait belt Activity Tolerance: Patient tolerated treatment well Patient left: in chair;with call bell/phone within reach Nurse Communication: Mobility status PT Visit Diagnosis: Muscle weakness (generalized) (M62.81);Difficulty in walking, not elsewhere classified (R26.2)    Time: 9390-3009 PT Time Calculation (min) (ACUTE ONLY): 19 min   Charges:   PT Evaluation $PT Eval Low Complexity: 1 Low          Wynn Maudlin, DPT Acute Rehabilitation Services Office [phone] Pager [phone]   Anitra Lauth 08/17/2021, 2:08 PM

## 2021-08-17 NOTE — Interval H&P Note (Signed)
History and Physical Interval Note:  08/17/2021 7:31 AM  Sandra Curry  has presented today for surgery, with the diagnosis of OA LEFT KNEE.  The various methods of treatment have been discussed with the patient and family. After consideration of risks, benefits and other options for treatment, the patient has consented to  Procedure(s): TOTAL KNEE ARTHROPLASTY (Left) as a surgical intervention.  The patient's history has been reviewed, patient examined, no change in status, stable for surgery.  I have reviewed the patient's chart and labs.  Questions were answered to the patient's satisfaction.     Thera Flake

## 2021-08-17 NOTE — Anesthesia Postprocedure Evaluation (Addendum)
Anesthesia Post Note  Patient: Forensic scientist  Procedure(s) Performed: TOTAL KNEE ARTHROPLASTY (Left: Knee)     Patient location during evaluation: PACU Anesthesia Type: Spinal Level of consciousness: oriented and awake and alert Pain management: pain level controlled Vital Signs Assessment: post-procedure vital signs reviewed and stable Respiratory status: spontaneous breathing, respiratory function stable and nonlabored ventilation Cardiovascular status: blood pressure returned to baseline and stable Postop Assessment: no headache, no backache, no apparent nausea or vomiting, spinal receding and patient able to bend at knees Anesthetic complications: no   No notable events documented.  Last Vitals:  Vitals:   08/17/21 1100 08/17/21 1115  BP: (!) 132/92 133/80  Pulse: (!) 56 62  Resp: (!) 9 19  Temp: 36.6 C 36.6 C  SpO2: 100% 94%    Last Pain:  Vitals:   08/17/21 1115  TempSrc: Oral  PainSc: 6                  Lashauna Arpin A.

## 2021-08-17 NOTE — Op Note (Signed)
NAMERoslynn, [name] MEDICAL RECORD NO: [id number] ACCOUNT NO: [id number] DATE OF BIRTH: 07-Jun-1956 FACILITY: [facility] LOCATION: [facility] PHYSICIAN: W D. Carloyn Manner., MD  Operative Report   DATE OF PROCEDURE: 08/17/2021  PREOPERATIVE DIAGNOSIS:  Severe osteoarthritis, left knee.  POSTOPERATIVE DIAGNOSIS:  Severe osteoarthritis, left knee.  PROCEDURE:  Left total knee replacement (Attune cemented knee size 4 femur, size 3 tibia with 5 mm size 4 tibial bearing, 32 mm all poly patella).  SURGEON:  W D. Carloyn Manner., MD  ASSISTANTVincent Peyer.  ANESTHESIA:  Spinal with block.  TOURNIQUET TIME:  50 minutes.  DESCRIPTION OF THE PROCEDURE:  Supine position, exsanguination of the leg and inflation of the thigh tourniquet to 350.  Straight skin incision with a medial parapatellar approach to the knee made.  We did a 10 mm 5-degree valgus cut on the femur  followed by cutting about 3 mm below the most diseased medial compartment with the extension gap measured at 5 mm.  Femur was sized to be a size 4 placement of the all-in-1 cutting block in the appropriate degree of external rotation accomplishing the  anterior, posterior and chamfer cuts.  Flexion gap equal to the extension gap at 5 mm.  PCL was completely released.  Small osteophytes were removed from the posterior aspect of the knee.  Keel cut was made on the tibia with the size of the tibia being 3  followed by the box cut on the femur.  Patella was cut, resecting 7.5 mm of patella.  All trials were placed.  The patient had full extension, no instability noted with full range of motion.  The components were inserted tibia followed by femur, patella with the trial bearing.  Cement was allowed to harden.  The trial bearing was removed.  No excessive cement was noted.  Tourniquet was released.  No excessive bleeding was noted.  Small  bleeders were coagulated.  The final bearing was placed and closure was effected with #1 Ethibond, 2-0 Vicryl, and  Monocryl.  Taken to recovery room in stable condition.   PUS D: 08/17/2021 9:14:13 am T: 08/17/2021 9:27:00 am  JOB: [id number]/ [id number]

## 2021-08-17 NOTE — Anesthesia Procedure Notes (Signed)
Anesthesia Regional Block: Adductor canal block   Pre-Anesthetic Checklist: , timeout performed,  Correct Patient, Correct Site, Correct Laterality,  Correct Procedure, Correct Position, site marked,  Risks and benefits discussed,  Surgical consent,  Pre-op evaluation,  At surgeon's request and post-op pain management  Laterality: Left and Lower  Prep: chloraprep       Needles:  Injection technique: Single-shot  Needle Type: Echogenic Stimulator Needle     Needle Length: 10cm  Needle Gauge: 21   Needle insertion depth: 7 cm   Additional Needles:   Narrative:  Start time: 08/17/2021 7:00 AM End time: 08/17/2021 7:05 AM Injection made incrementally with aspirations every 5 mL.  Performed by: Personally  Anesthesiologist: Mal Amabile, MD  Additional Notes: Timeout performed. Patient sedated. Relevant anatomy ID'd using [place]. Incremental 2-78ml injection of LA with frequent aspiration. Patient tolerated procedure well.    Left Adductor Canal Block

## 2021-08-17 NOTE — Progress Notes (Signed)
Physical Therapy Treatment Patient Details Name: Sandra Curry MRN: [id number] DOB: 19-Dec-1955 Today's Date: 08/17/2021   History of Present Illness Patient is 65 y.o. female s/p Lt TKA on 08/17/21 with PMH significant for OA, HTN.    PT Comments    Patient more alert this session and progressing well with mobility. Patient ambulated increased distance of 36' with min guard/supervision for safety. No LOB noted. Instructed pt on HEP for ROM and circulation and educated on stair mobility, pt declined to practice steps as she reports having none at home. She is mobilizing at safe level for discharge home with assist from family. Acute PT will continue to progress pt as able.    Recommendations for follow up therapy are one component of a multi-disciplinary discharge planning process, led by the attending physician.  Recommendations may be updated based on patient status, additional functional criteria and insurance authorization.  Follow Up Recommendations  Follow physician's recommendations for discharge plan and follow up therapies     Assistance Recommended at Discharge Frequent or constant Supervision/Assistance  Equipment Recommendations  None recommended by PT    Recommendations for Other Services       Precautions / Restrictions Precautions Precautions: Fall Restrictions Weight Bearing Restrictions: No     Mobility  Bed Mobility               General bed mobility comments: Pt OOB in recliner    Transfers Overall transfer level: Needs assistance Equipment used: Rolling walker (2 wheels) Transfers: Sit to/from Stand Sit to Stand: Min guard           General transfer comment: guarding for safety with rise from recliner and toilet. cues to extend Lt knee with sitting on low surface. no assist needed.    Ambulation/Gait Ambulation/Gait assistance: Min guard;Supervision Gait Distance (Feet): 70 Feet Assistive device: Rolling walker (2 wheels) Gait  Pattern/deviations: Step-to pattern;Decreased stride length;Decreased weight shift to left Gait velocity: decr     General Gait Details: cues for step to pattern and walker proximity, no buckling at Lt knee and no LOB noted. pt maintain safe walker management throughout.   Stairs Stairs:  (Patient denies stesp at home to enter and declined to practice for doctors office.)           Wheelchair Mobility    Modified Rankin (Stroke Patients Only)       Balance Overall balance assessment: Needs assistance Sitting-balance support: Feet supported;Bilateral upper extremity supported Sitting balance-Leahy Scale: Fair     Standing balance support: Reliant on assistive device for balance;During functional activity;Bilateral upper extremity supported Standing balance-Leahy Scale: Poor                              Cognition Arousal/Alertness: Lethargic;Suspect due to medications Behavior During Therapy: [facility] for tasks assessed/performed Overall Cognitive Status: Within Functional Limits for tasks assessed                                          Exercises Total Joint Exercises Ankle Circles/Pumps: AROM;Both;10 reps;Seated Quad Sets: AROM;Left;Other reps (comment);Seated Short Arc Quad: AROM;Left;Other reps (comment);Seated Heel Slides: AROM;Left;Other reps (comment);Seated Hip ABduction/ADduction: AROM;Left;Other reps (comment);Seated    General Comments        Pertinent Vitals/Pain Pain Assessment: 0-10 Pain Score: 6  Pain Location: Lt knee Pain Descriptors / Indicators: Aching;Discomfort Pain  Intervention(s): Limited activity within patient's tolerance;Monitored during session;Repositioned;Patient requesting pain meds-RN notified    Home Living                          Prior Function            PT Goals (current goals can now be found in the care plan section) Acute Rehab PT Goals Patient Stated Goal: get home today PT  Goal Formulation: With patient Time For Goal Achievement: 08/24/21 Potential to Achieve Goals: Good Progress towards PT goals: Progressing toward goals    Frequency    7X/week      PT Plan      Co-evaluation              AM-PAC PT "6 Clicks" Mobility   Outcome Measure  Help needed turning from your back to your side while in a flat bed without using bedrails?: A Little Help needed moving from lying on your back to sitting on the side of a flat bed without using bedrails?: A Little Help needed moving to and from a bed to a chair (including a wheelchair)?: A Little Help needed standing up from a chair using your arms (e.g., wheelchair or bedside chair)?: A Little Help needed to walk in hospital room?: A Little Help needed climbing 3-5 steps with a railing? : A Lot 6 Click Score: 17    End of Session Equipment Utilized During Treatment: Gait belt Activity Tolerance: Patient tolerated treatment well Patient left: in chair;with call bell/phone within reach Nurse Communication: Mobility status PT Visit Diagnosis: Muscle weakness (generalized) (M62.81);Difficulty in walking, not elsewhere classified (R26.2)     Time: 0340-3524 PT Time Calculation (min) (ACUTE ONLY): 23 min  Charges:  $Gait Training: 8-22 mins $Therapeutic Exercise: 8-22 mins                     Wynn Maudlin, DPT Acute Rehabilitation Services Office [phone] Pager [phone]     Anitra Lauth 08/17/2021, 4:30 PM

## 2021-08-17 NOTE — Anesthesia Procedure Notes (Signed)
Spinal  Patient location during procedure: OR Start time: 08/17/2021 7:35 AM End time: 08/17/2021 7:39 AM Reason for block: surgical anesthesia Staffing Performed: anesthesiologist  Anesthesiologist: Mal Amabile, MD Preanesthetic Checklist Completed: patient identified, IV checked, site marked, risks and benefits discussed, surgical consent, monitors and equipment checked, pre-op evaluation and timeout performed Spinal Block Patient position: sitting Prep: DuraPrep and site prepped and draped Patient monitoring: heart rate, cardiac monitor, continuous pulse ox and blood pressure Approach: midline Location: L3-4 Injection technique: single-shot Needle Needle type: Pencan  Needle gauge: 24 G Needle length: 9 cm Needle insertion depth: 6 cm Assessment Sensory level: T4 Events: CSF return Additional Notes Patient tolerated procedure well. Adequate sensory level.

## 2021-08-17 NOTE — Anesthesia Procedure Notes (Signed)
Date/Time: 08/17/2021 6:46 AM Performed by: Minerva Ends, CRNA Pre-anesthesia Checklist: Patient identified, Emergency Drugs available, Suction available, Patient being monitored and Timeout performed Patient Re-evaluated:Patient Re-evaluated prior to induction Oxygen Delivery Method: Nasal cannula Placement Confirmation: positive ETCO2 and breath sounds checked- equal and bilateral Dental Injury: Teeth and Oropharynx as per pre-operative assessment

## 2021-08-17 NOTE — Brief Op Note (Signed)
08/17/2021  9:47 AM  PATIENT:  Sandra Curry  65 y.o. female  PRE-OPERATIVE DIAGNOSIS:  OA LEFT KNEE  POST-OPERATIVE DIAGNOSIS:  OA LEFT KNEE  PROCEDURE:  Procedure(s): TOTAL KNEE ARTHROPLASTY (Left)  SURGEON:  Surgeon(s) and Role:    Frederico Hamman, MD - Primary  PHYSICIAN ASSISTANT: Margart Sickles, PA-C  ASSISTANTS: OR staff x1   ANESTHESIA:   local, regional, spinal, and IV sedation  EBL:  50 mL   BLOOD ADMINISTERED:none  DRAINS: none   LOCAL MEDICATIONS USED:  MARCAINE     SPECIMEN:  No Specimen  DISPOSITION OF SPECIMEN:  N/A  COUNTS:  YES  TOURNIQUET:   Total Tourniquet Time Documented: Thigh (Left) - 52 minutes Total: Thigh (Left) - 52 minutes   DICTATION: .Other Dictation: Dictation Number unknown  PLAN OF CARE: Discharge to home after PACU  PATIENT DISPOSITION:  PACU - hemodynamically stable.   Delay start of Pharmacological VTE agent (>24hrs) due to surgical blood loss or risk of bleeding: yes

## 2021-08-21 ENCOUNTER — Encounter (HOSPITAL_COMMUNITY): Payer: Self-pay | Admitting: Orthopedic Surgery

## 2021-10-23 ENCOUNTER — Other Ambulatory Visit: Payer: Self-pay | Admitting: Internal Medicine

## 2021-10-23 DIAGNOSIS — Z139 Encounter for screening, unspecified: Secondary | ICD-10-CM

## 2021-11-07 ENCOUNTER — Ambulatory Visit
Admission: RE | Admit: 2021-11-07 | Discharge: 2021-11-07 | Disposition: A | Discharge status: Home / Self Care | Payer: PRIVATE HEALTH INSURANCE | Source: Ambulatory Visit | Attending: Internal Medicine | Admitting: Internal Medicine

## 2021-11-07 ENCOUNTER — Other Ambulatory Visit: Payer: Self-pay

## 2021-11-07 DIAGNOSIS — Z139 Encounter for screening, unspecified: Secondary | ICD-10-CM

## 2022-11-08 ENCOUNTER — Other Ambulatory Visit: Payer: Self-pay | Admitting: Internal Medicine

## 2022-11-08 DIAGNOSIS — Z1231 Encounter for screening mammogram for malignant neoplasm of breast: Secondary | ICD-10-CM

## 2022-11-19 ENCOUNTER — Ambulatory Visit
Admission: RE | Admit: 2022-11-19 | Discharge: 2022-11-19 | Disposition: A | Discharge status: Home / Self Care | Payer: PRIVATE HEALTH INSURANCE | Source: Ambulatory Visit | Attending: Internal Medicine | Admitting: Internal Medicine

## 2022-11-19 DIAGNOSIS — Z1231 Encounter for screening mammogram for malignant neoplasm of breast: Secondary | ICD-10-CM

## 2023-09-23 DIAGNOSIS — Z1331 Encounter for screening for depression: Secondary | ICD-10-CM | POA: Diagnosis not present

## 2023-09-23 DIAGNOSIS — I10 Essential (primary) hypertension: Secondary | ICD-10-CM | POA: Diagnosis not present

## 2023-09-23 DIAGNOSIS — R5383 Other fatigue: Secondary | ICD-10-CM | POA: Diagnosis not present

## 2023-09-23 DIAGNOSIS — Z1339 Encounter for screening examination for other mental health and behavioral disorders: Secondary | ICD-10-CM | POA: Diagnosis not present

## 2023-09-23 DIAGNOSIS — E7800 Pure hypercholesterolemia, unspecified: Secondary | ICD-10-CM | POA: Diagnosis not present

## 2023-09-23 DIAGNOSIS — Z79899 Other long term (current) drug therapy: Secondary | ICD-10-CM | POA: Diagnosis not present

## 2023-09-23 DIAGNOSIS — Z7189 Other specified counseling: Secondary | ICD-10-CM | POA: Diagnosis not present

## 2023-09-23 DIAGNOSIS — E559 Vitamin D deficiency, unspecified: Secondary | ICD-10-CM | POA: Diagnosis not present

## 2023-09-23 DIAGNOSIS — Z299 Encounter for prophylactic measures, unspecified: Secondary | ICD-10-CM | POA: Diagnosis not present

## 2023-09-23 DIAGNOSIS — Z0000 Encounter for general adult medical examination without abnormal findings: Secondary | ICD-10-CM | POA: Diagnosis not present

## 2023-09-29 DIAGNOSIS — E2839 Other primary ovarian failure: Secondary | ICD-10-CM | POA: Diagnosis not present

## 2023-09-29 DIAGNOSIS — Z13820 Encounter for screening for osteoporosis: Secondary | ICD-10-CM | POA: Diagnosis not present

## 2023-10-05 IMAGING — MG MM DIGITAL SCREENING BILAT W/ TOMO AND CAD
8 series · 9 of 24 positions shown · non-contrast
Comparison: Previous exam(s).

CLINICAL DATA: Screening.

EXAM:
DIGITAL SCREENING BILATERAL MAMMOGRAM WITH TOMOSYNTHESIS AND CAD
TECHNIQUE: Bilateral screening digital craniocaudal and mediolateral oblique
mammograms were obtained. Bilateral screening digital breast
tomosynthesis was performed. The images were evaluated with
computer-aided detection.

[L CC synth-2D]
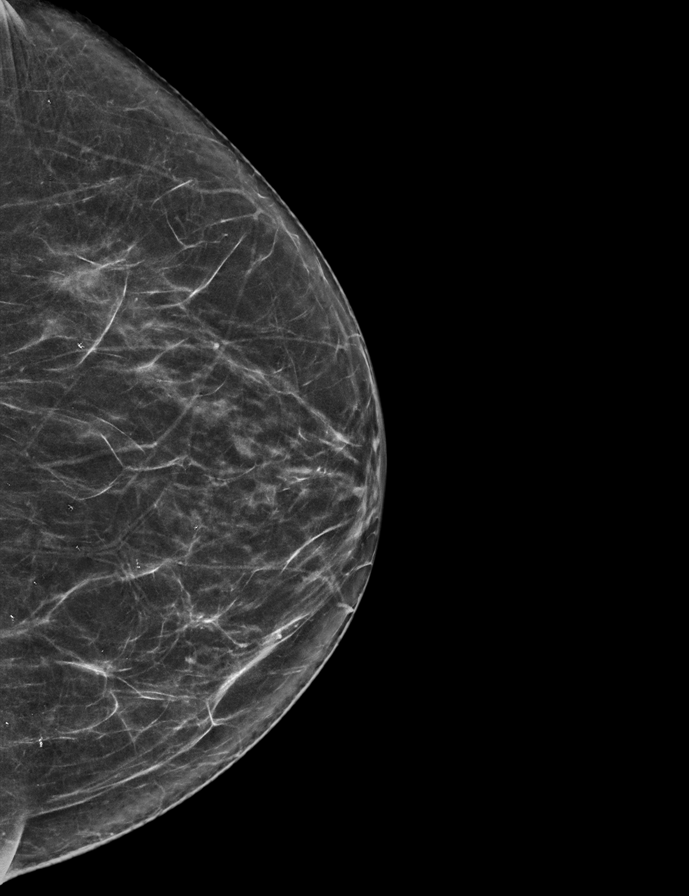

[R CC synth-2D]
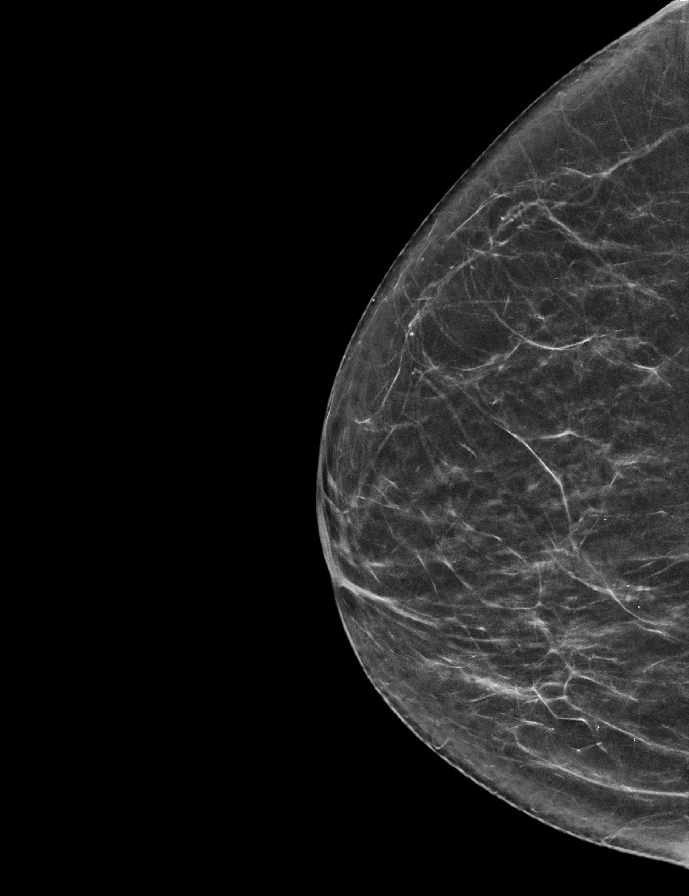

[L MLO synth-2D]
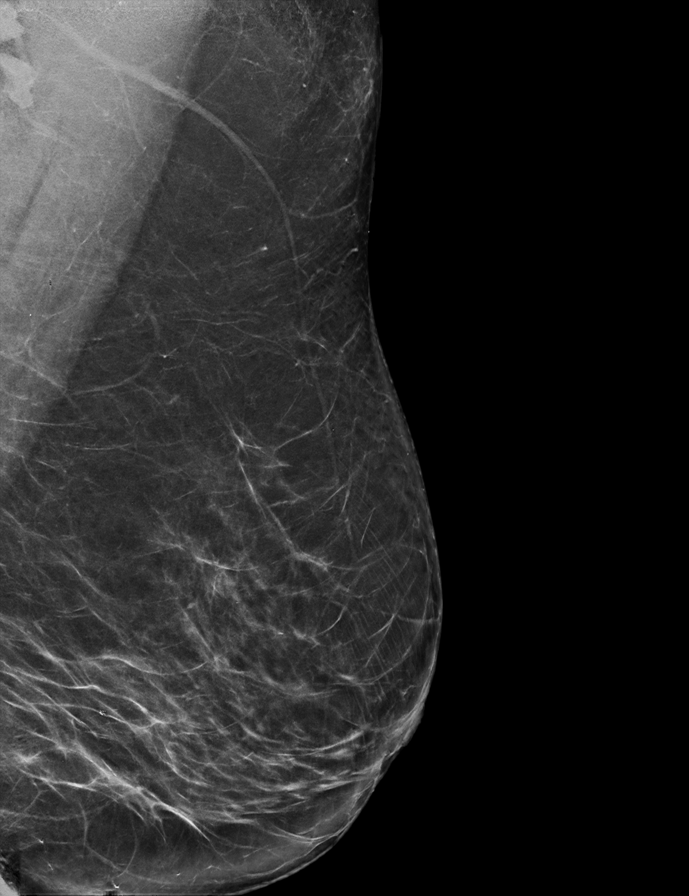

[R MLO synth-2D]
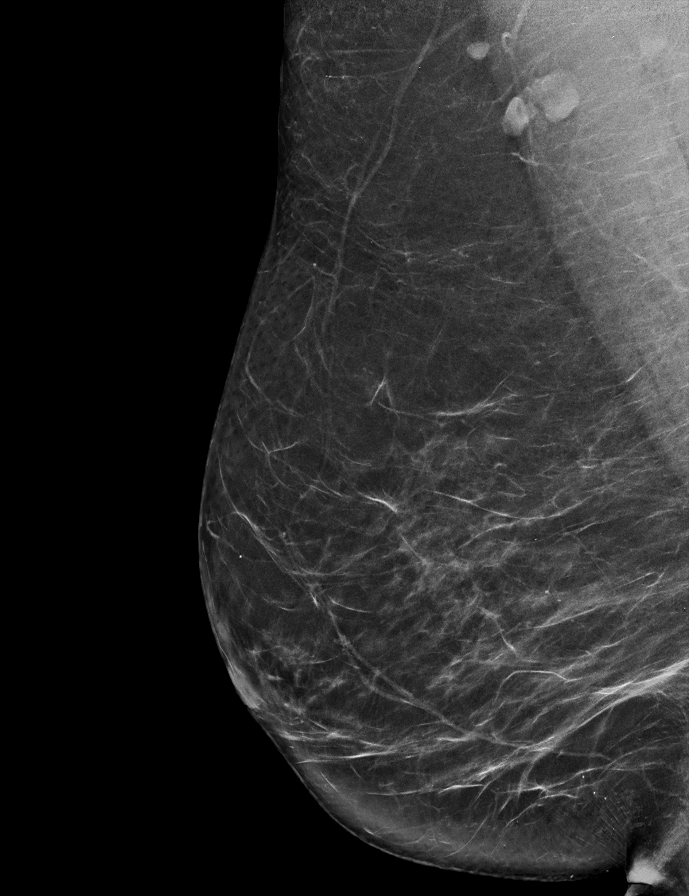

[L CC tomo · 2 of 64 frames shown]
[frame 21/64]
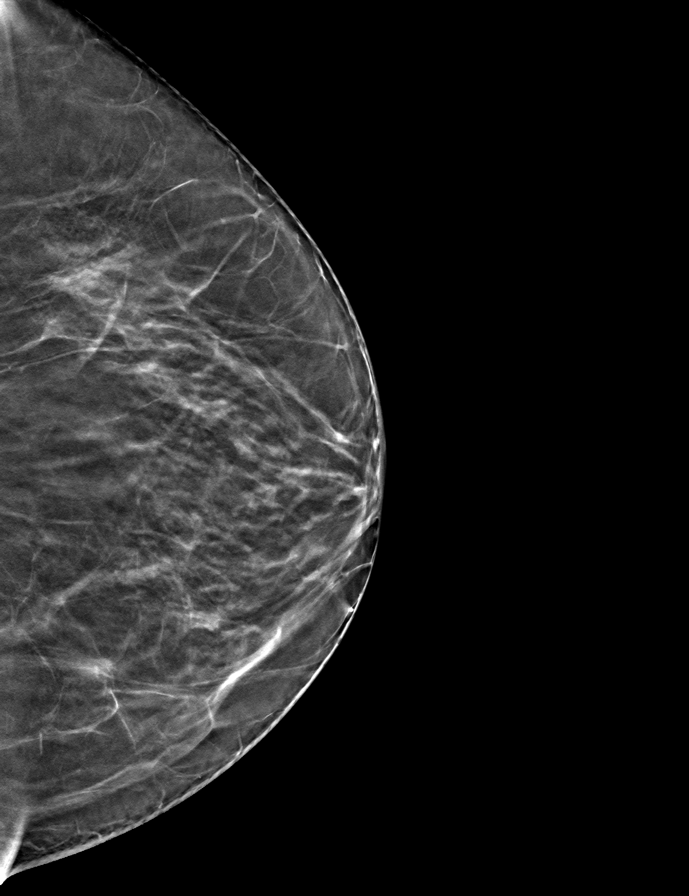
[frame 33/64]
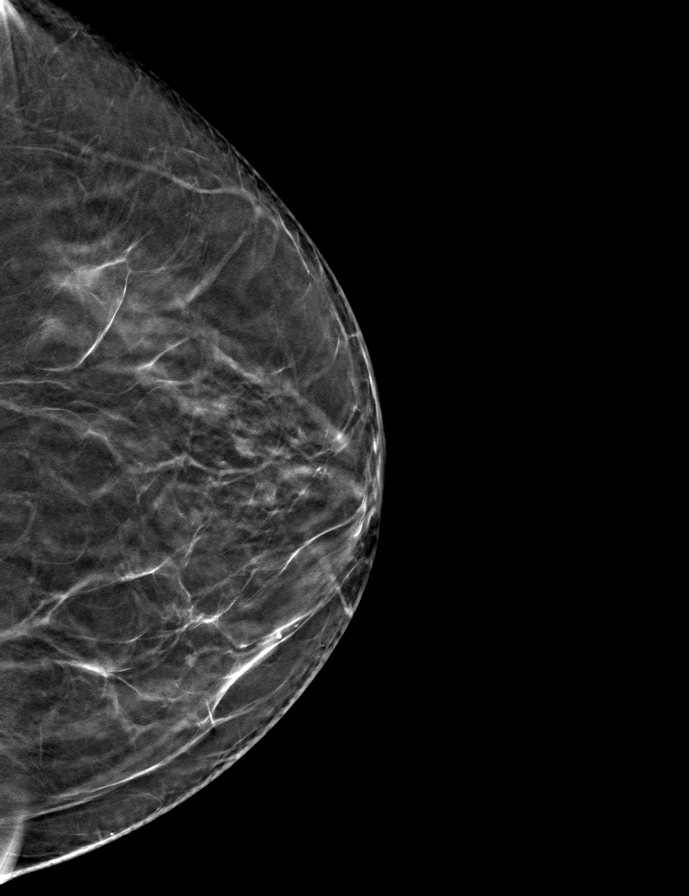

[R CC tomo · tomo slice 31/61.0]
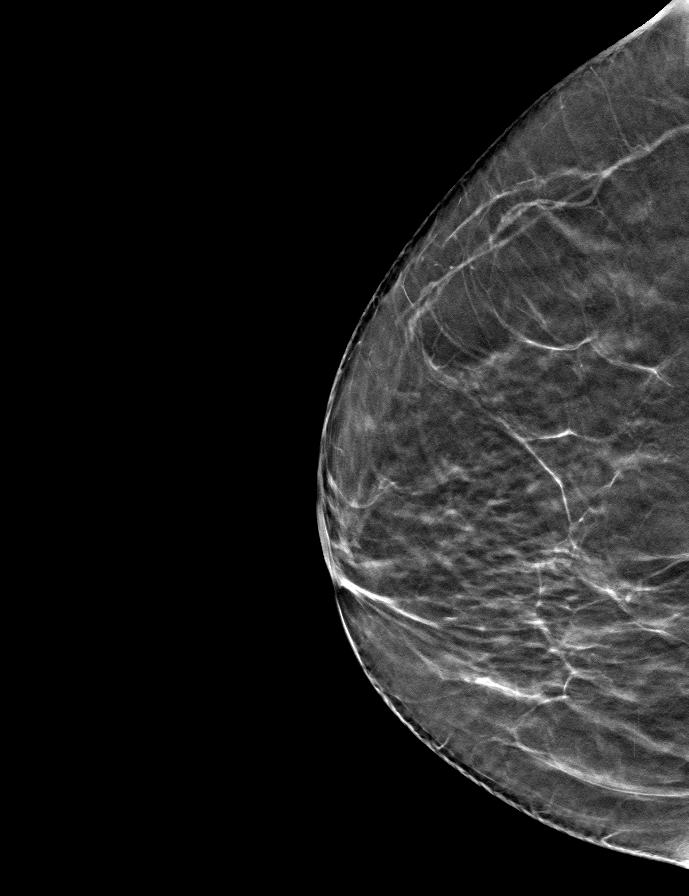

[L MLO tomo · tomo slice 40/79.0]
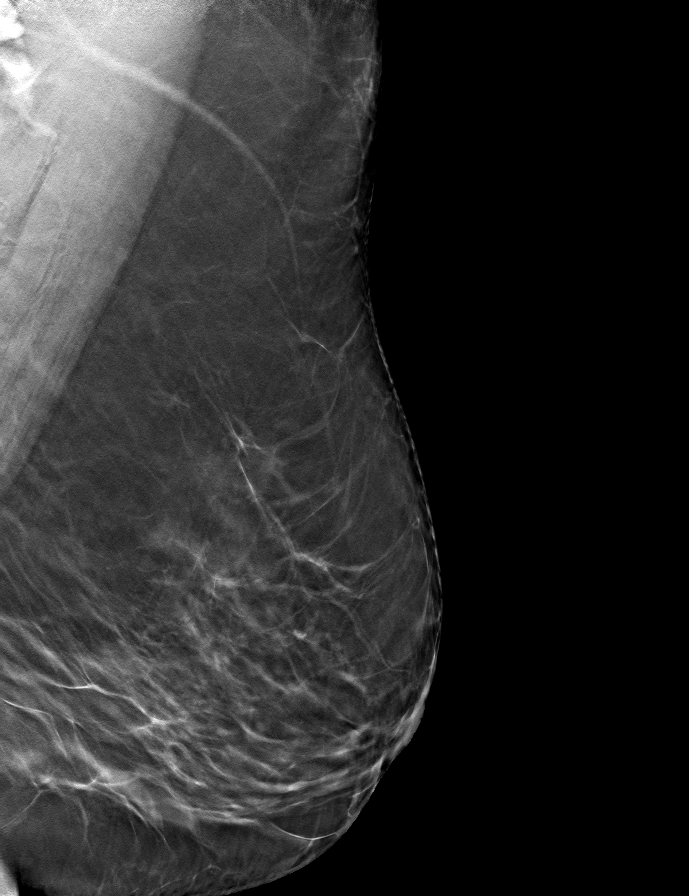

[R MLO tomo · tomo slice 39/76.0]
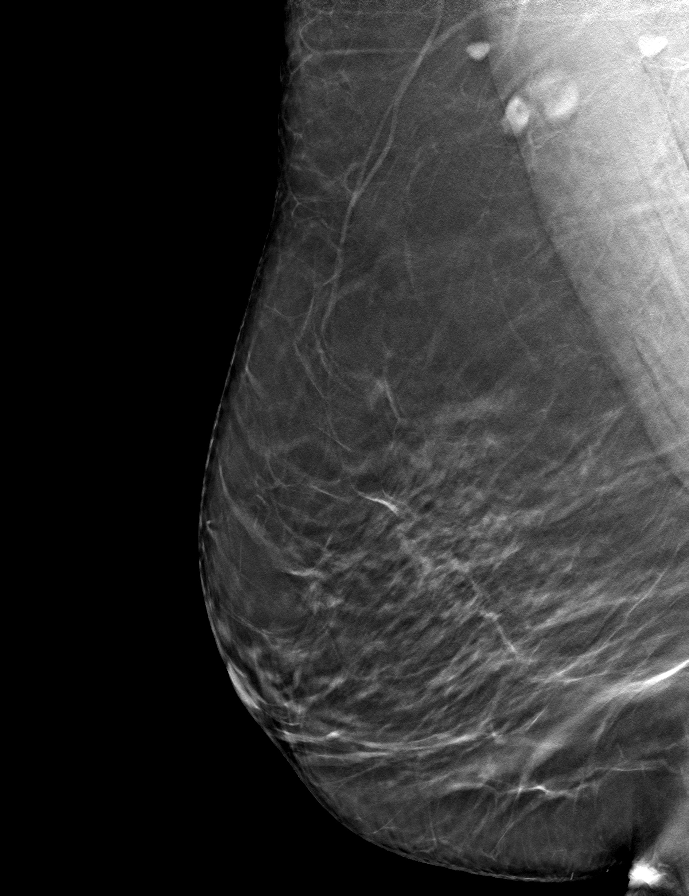

[9 of 24 positions shown; findings below may reference images not displayed]

ACR Breast Density Category b: There are scattered areas of
fibroglandular density.
FINDINGS: There are no findings suspicious for malignancy.
IMPRESSION: No mammographic evidence of malignancy. A result letter of this
screening mammogram will be mailed directly to the patient.

RECOMMENDATION:
Screening mammogram in one year. (Code:51-O-LD2)

BI-RADS CATEGORY  1: Negative.

## 2023-11-19 ENCOUNTER — Other Ambulatory Visit: Payer: Self-pay | Admitting: Internal Medicine

## 2023-11-19 DIAGNOSIS — Z1231 Encounter for screening mammogram for malignant neoplasm of breast: Secondary | ICD-10-CM

## 2023-11-25 ENCOUNTER — Ambulatory Visit
Admission: RE | Admit: 2023-11-25 | Discharge: 2023-11-25 | Disposition: A | Discharge status: Home / Self Care | Payer: PRIVATE HEALTH INSURANCE | Source: Ambulatory Visit | Attending: Internal Medicine | Admitting: Internal Medicine

## 2023-11-25 DIAGNOSIS — Z1231 Encounter for screening mammogram for malignant neoplasm of breast: Secondary | ICD-10-CM | POA: Diagnosis not present

## 2023-12-23 DIAGNOSIS — I10 Essential (primary) hypertension: Secondary | ICD-10-CM | POA: Diagnosis not present

## 2023-12-23 DIAGNOSIS — Z299 Encounter for prophylactic measures, unspecified: Secondary | ICD-10-CM | POA: Diagnosis not present

## 2023-12-23 DIAGNOSIS — J302 Other seasonal allergic rhinitis: Secondary | ICD-10-CM | POA: Diagnosis not present

## 2023-12-23 DIAGNOSIS — Z6829 Body mass index (BMI) 29.0-29.9, adult: Secondary | ICD-10-CM | POA: Diagnosis not present

## 2024-03-23 DIAGNOSIS — J302 Other seasonal allergic rhinitis: Secondary | ICD-10-CM | POA: Diagnosis not present

## 2024-03-23 DIAGNOSIS — Z299 Encounter for prophylactic measures, unspecified: Secondary | ICD-10-CM | POA: Diagnosis not present

## 2024-03-23 DIAGNOSIS — I10 Essential (primary) hypertension: Secondary | ICD-10-CM | POA: Diagnosis not present

## 2024-06-24 DIAGNOSIS — R600 Localized edema: Secondary | ICD-10-CM | POA: Diagnosis not present

## 2024-06-24 DIAGNOSIS — I10 Essential (primary) hypertension: Secondary | ICD-10-CM | POA: Diagnosis not present

## 2024-06-24 DIAGNOSIS — Z299 Encounter for prophylactic measures, unspecified: Secondary | ICD-10-CM | POA: Diagnosis not present
